# Patient Record
Sex: Female | Born: 1935 | Race: White | Hispanic: No | Smoking: Former smoker
Health system: Southern US, Community
[De-identification: ages and names within clinical notes are randomized; demographics above are authoritative.]

## PROBLEM LIST (undated history)

## (undated) DIAGNOSIS — F0390 Unspecified dementia without behavioral disturbance: Secondary | ICD-10-CM

## (undated) DIAGNOSIS — F419 Anxiety disorder, unspecified: Secondary | ICD-10-CM

## (undated) DIAGNOSIS — E785 Hyperlipidemia, unspecified: Secondary | ICD-10-CM

## (undated) DIAGNOSIS — I10 Essential (primary) hypertension: Secondary | ICD-10-CM

## (undated) HISTORY — PX: EYE SURGERY: SHX253

## (undated) HISTORY — DX: Hyperlipidemia, unspecified: E78.5

## (undated) HISTORY — DX: Anxiety disorder, unspecified: F41.9

## (undated) HISTORY — PX: APPENDECTOMY: SHX54

## (undated) HISTORY — DX: Essential (primary) hypertension: I10

---

## 2004-06-13 ENCOUNTER — Ambulatory Visit: Payer: Self-pay | Admitting: Internal Medicine

## 2005-06-26 ENCOUNTER — Ambulatory Visit: Payer: Self-pay | Admitting: Internal Medicine

## 2006-06-30 ENCOUNTER — Ambulatory Visit: Payer: Self-pay | Admitting: Internal Medicine

## 2007-07-12 ENCOUNTER — Ambulatory Visit: Payer: Self-pay | Admitting: Internal Medicine

## 2008-04-06 ENCOUNTER — Ambulatory Visit: Payer: Self-pay | Admitting: Unknown Physician Specialty

## 2008-07-13 ENCOUNTER — Ambulatory Visit: Payer: Self-pay | Admitting: Internal Medicine

## 2008-07-18 ENCOUNTER — Ambulatory Visit: Payer: Self-pay | Admitting: Internal Medicine

## 2009-01-01 ENCOUNTER — Emergency Department: Payer: Self-pay | Admitting: Emergency Medicine

## 2009-01-18 ENCOUNTER — Ambulatory Visit: Payer: Self-pay | Admitting: Internal Medicine

## 2009-01-24 ENCOUNTER — Ambulatory Visit: Payer: Self-pay | Admitting: Ophthalmology

## 2009-02-06 ENCOUNTER — Ambulatory Visit: Payer: Self-pay | Admitting: Internal Medicine

## 2009-03-15 ENCOUNTER — Ambulatory Visit: Payer: Self-pay | Admitting: Ophthalmology

## 2009-03-28 ENCOUNTER — Ambulatory Visit: Payer: Self-pay | Admitting: Ophthalmology

## 2009-08-02 ENCOUNTER — Ambulatory Visit: Payer: Self-pay | Admitting: Internal Medicine

## 2010-02-27 ENCOUNTER — Ambulatory Visit: Payer: Self-pay

## 2010-09-19 ENCOUNTER — Ambulatory Visit: Payer: Self-pay | Admitting: Gastroenterology

## 2010-10-01 ENCOUNTER — Emergency Department: Payer: Self-pay | Admitting: *Deleted

## 2010-10-07 ENCOUNTER — Ambulatory Visit: Payer: Self-pay | Admitting: Gastroenterology

## 2010-10-18 ENCOUNTER — Emergency Department: Payer: Self-pay | Admitting: Emergency Medicine

## 2013-03-23 IMAGING — CT CT ABDOMEN AND PELVIS WITHOUT AND WITH CONTRAST
2 of 6 series · 13 of 32 positions shown, 19 images · non-contrast
Comparison: none

REASON FOR EXAM: hematuria RLQ abd pain
COMMENTS:

PROCEDURE:     KCT - KCT ABDOMEN/PELVIS W/WO  - September 19, 2010 [DATE]
RESULT:
TECHNIQUE: Helical 3 mm sections were obtained from the lung bases through
the pubic symphysis without administration of oral or intravenous contrast.
This was followed by administration of oral contrast and 15 ml of
0sovue-NII. Also included are delayed images.

[Series 2: stone 3.0 i40f 3 · axial · 0.69mm/px · z∈[-624,-207]mm · 11 of 167 slices shown, 17 images]
[im 14/167  soft-tissue]
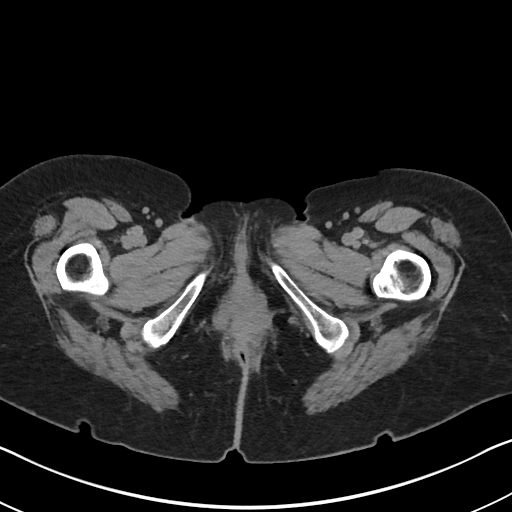
[im 14/167  bone]
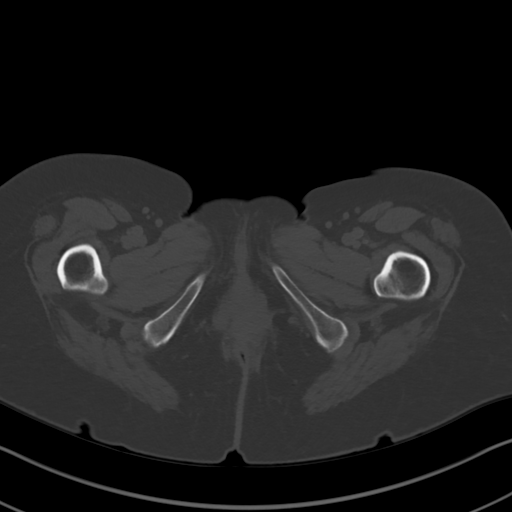
[im 28/167  soft-tissue]
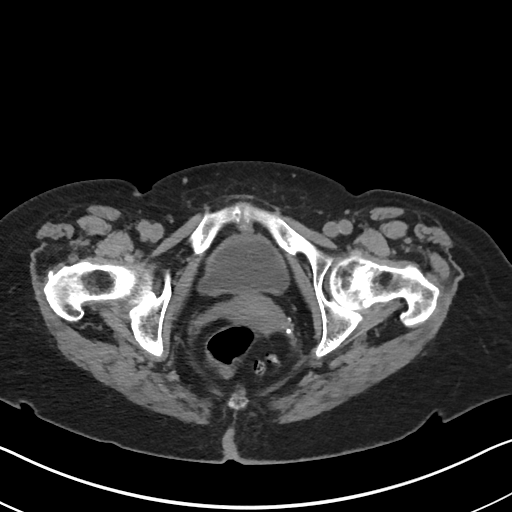
[im 42/167  soft-tissue]
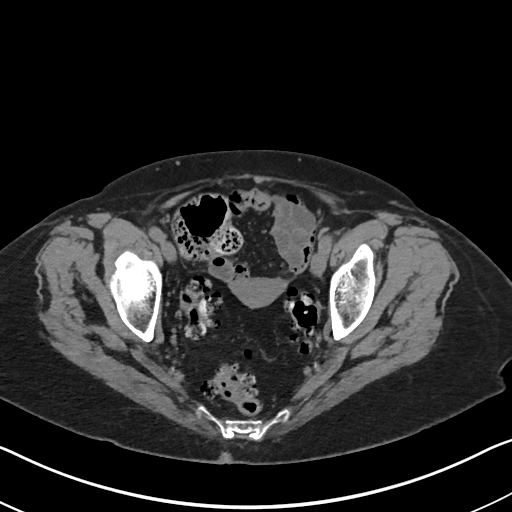
[im 56/167  soft-tissue]
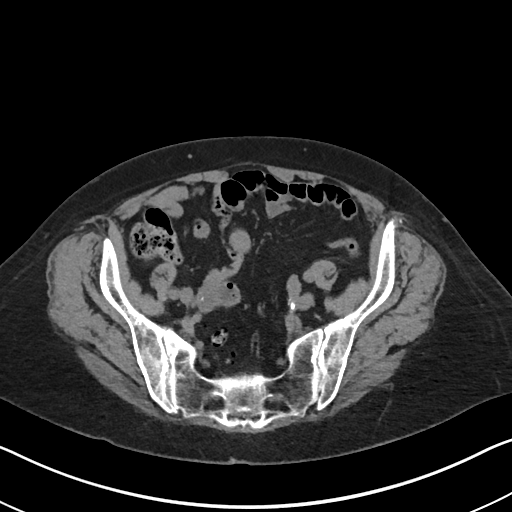
[im 70/167  soft-tissue]
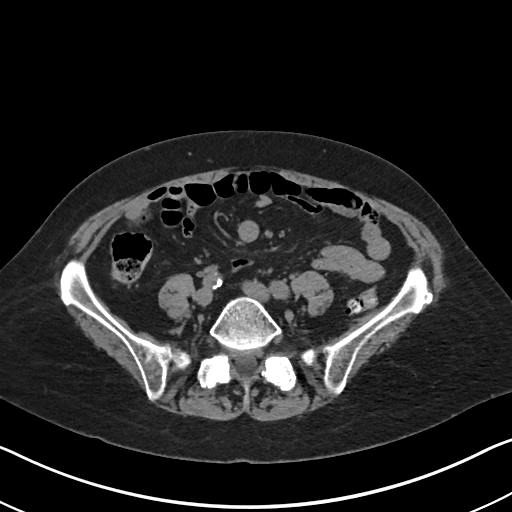
[im 84/167  soft-tissue]
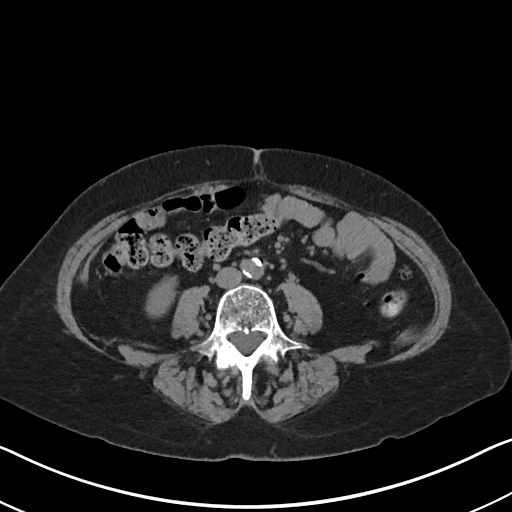
[im 97/167  soft-tissue]
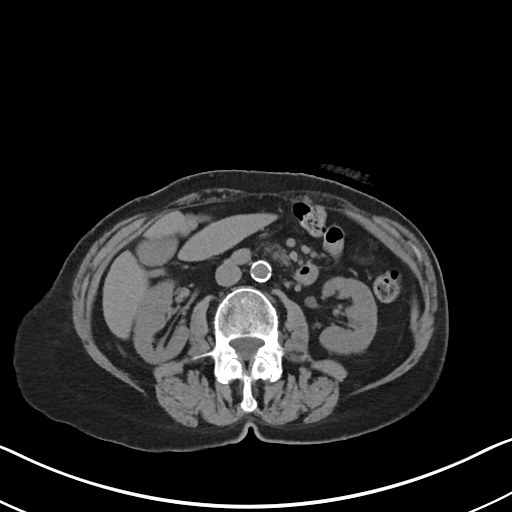
[im 111/167  soft-tissue]
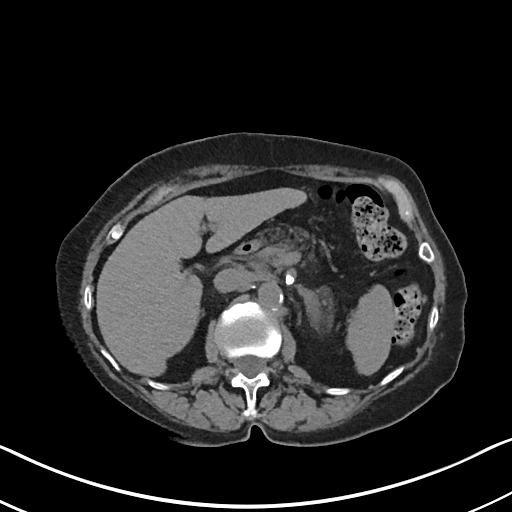
[im 111/167  lung]
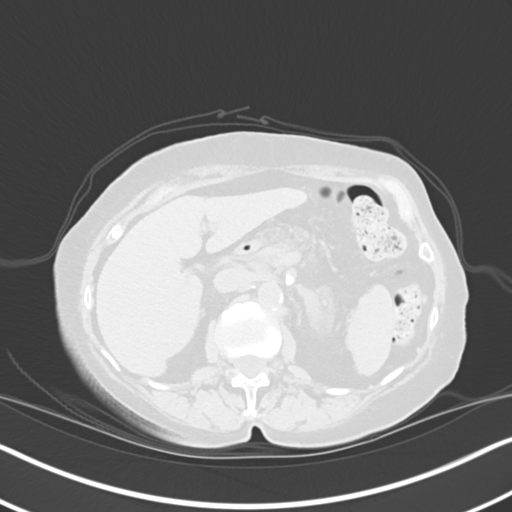
[im 125/167  soft-tissue]
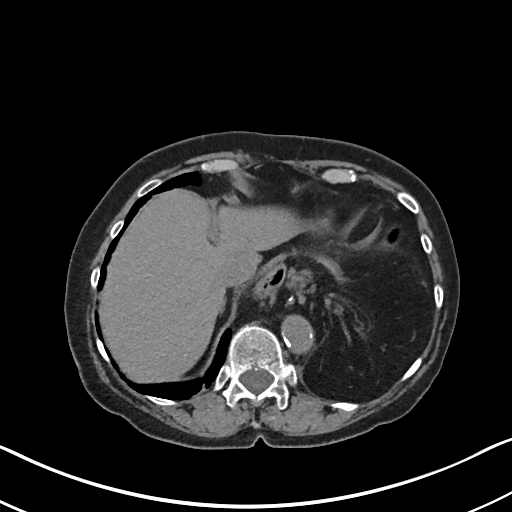
[im 125/167  lung]
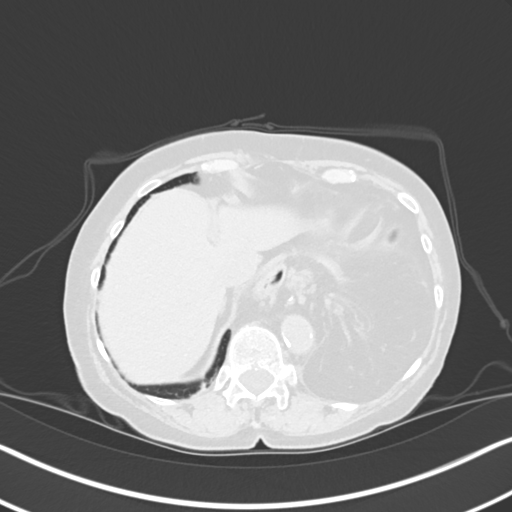
[im 125/167  bone]
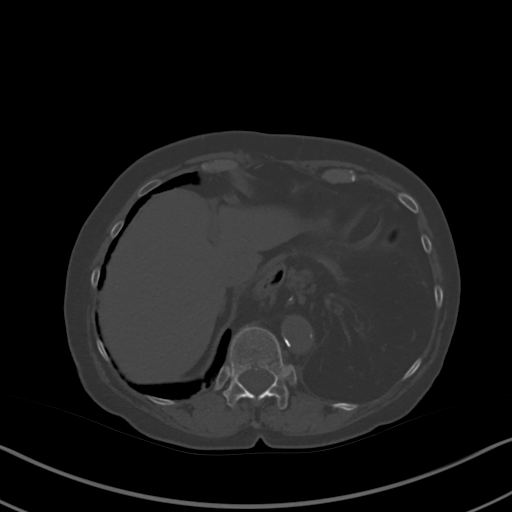
[im 139/167  soft-tissue]
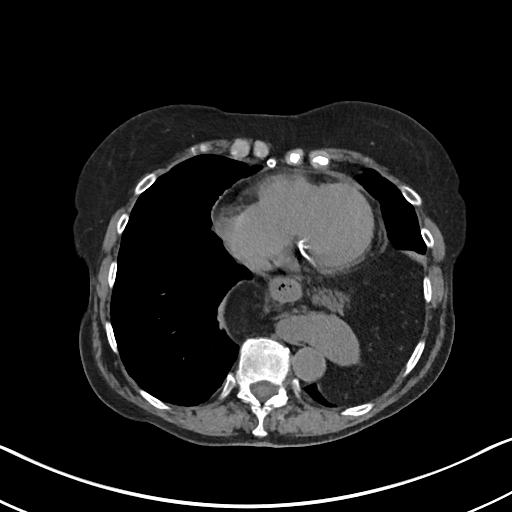
[im 139/167  lung]
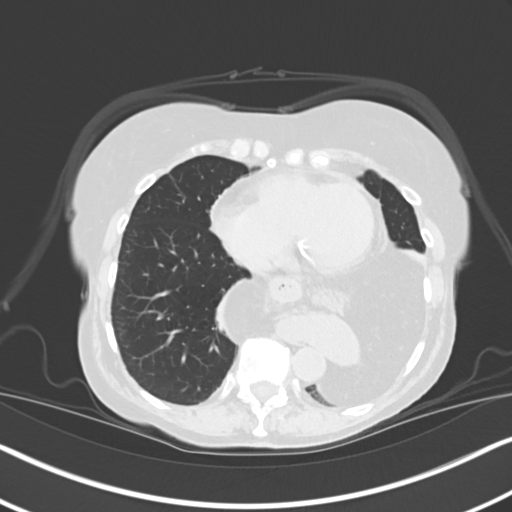
[im 153/167  soft-tissue]
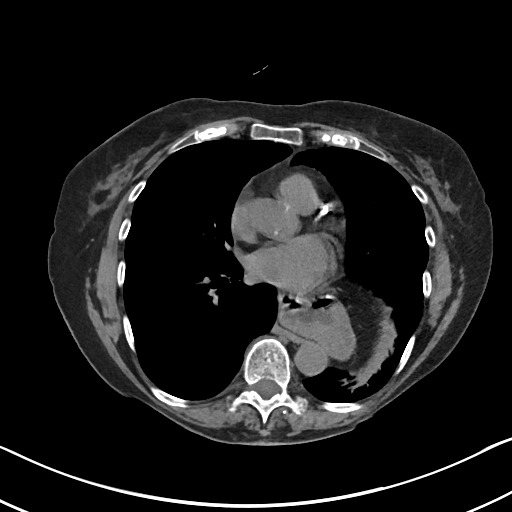
[im 153/167  lung]
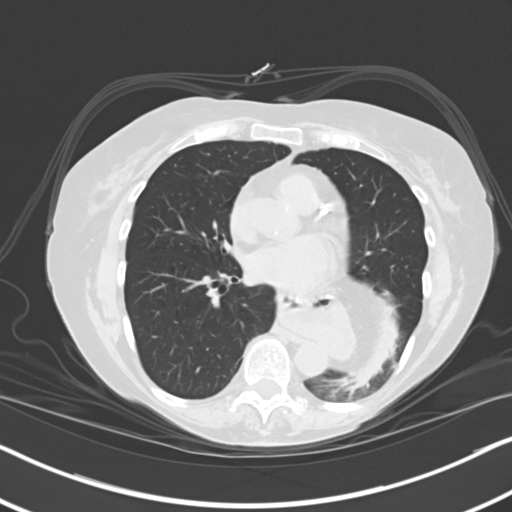

[Series 6: abd with 5.0 i40f 3 · axial · 0.89mm/px · z∈[-724,-644]mm · 2 of 96 slices shown]
[im 16/96  soft-tissue]
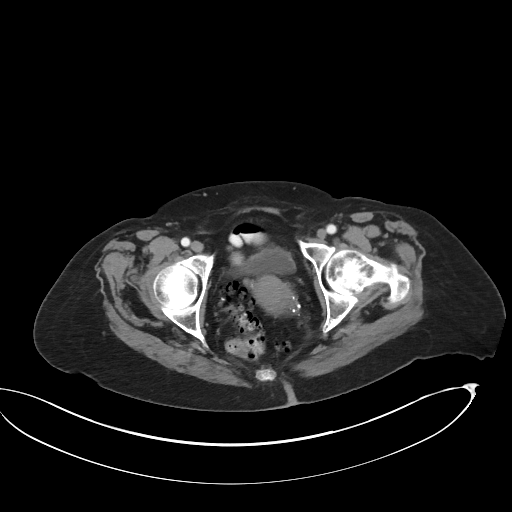
[im 32/96  soft-tissue]
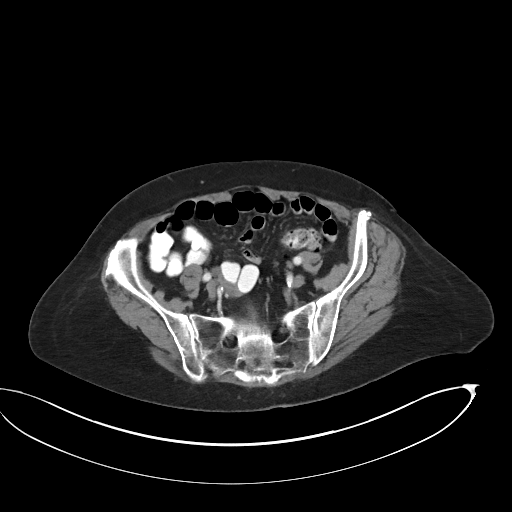

[13 of 32 positions shown; findings below may reference images not displayed]

FINDINGS: Evaluation of the lung bases demonstrates mild increased density
within the left lung base and differential considerations are atelectasis
versus infiltrate.

Evaluation of the right and left kidneys demonstrates no evidence of
hydronephrosis. There is no evidence of nephrolithiasis. There is no
evidence of hydroureter or ureterolithiasis.

The liver, spleen, adrenals, pancreas, and kidneys are unremarkable. There
is no evidence of abdominal aortic aneurysm. Diffuse diverticulosis is
appreciated within the sigmoid colon without evidence of diverticulitis.
There is no CT evidence of bowel obstruction or secondary signs reflecting
enteritis, colitis, diverticulitis or appendicitis. A hiatal hernia is
appreciated which contains a significant portion of the stomach. There is no
evidence of an abdominal aortic aneurysm.
IMPRESSION: 1.     Large, hiatal hernia containing a large portion of stomach.
2.     Diverticulosis within the sigmoid colon.
3.     No CT evidence of obstructive or inflammatory abnormalities.

## 2013-06-06 ENCOUNTER — Ambulatory Visit (INDEPENDENT_AMBULATORY_CARE_PROVIDER_SITE_OTHER): Payer: Medicare Other | Admitting: General Surgery

## 2013-06-06 ENCOUNTER — Encounter: Payer: Self-pay | Admitting: General Surgery

## 2013-06-06 VITALS — BP 120/70 | HR 80 | Resp 14 | Ht 65.5 in | Wt 138.0 lb

## 2013-06-06 DIAGNOSIS — C4490 Unspecified malignant neoplasm of skin, unspecified: Secondary | ICD-10-CM

## 2013-06-06 DIAGNOSIS — D485 Neoplasm of uncertain behavior of skin: Secondary | ICD-10-CM | POA: Insufficient documentation

## 2013-06-06 NOTE — Patient Instructions (Addendum)
Patient to return for excision of left arm skin cancer. The patient is aware to call back for any questions or concerns.

## 2013-06-06 NOTE — Progress Notes (Signed)
Patient ID: Kari Allen, female   DOB: April 20, 1927, 80 y.o.   MRN: [id number]  Chief Complaint  Patient presents with  . Other    Left upper arm skin cancer    HPI Kari Allen is a 80 y.o. female who presents for an evaluation of left upper arm skin cancer. The patient had a biopsy done on 05/25/13 at [facility] with Josue Hector, M.D.. The patient states the area has been present for approximately 8-9 months prior to recent excision. She states the area had a scab that came off and the area didn't look right. She is concerned about a lesion on the left forearm which he thinks looks similar to what the index lesion on the upper arm.  HPI  Past Medical History  Diagnosis Date  . Hyperlipidemia   . Hypertension   . Anxiety     Past Surgical History  Procedure Laterality Date  . Appendectomy    . Cesarean section    . Eye surgery Bilateral     cataract    History reviewed. No pertinent family history.  Social History History  Substance Use Topics  . Smoking status: Former Smoker -- 1.00 packs/day for 15 years    Types: Cigarettes  . Smokeless tobacco: Never Used  . Alcohol Use: Yes    No Known Allergies  Current Outpatient Prescriptions  Medication Sig Dispense Refill  . Calcium Carbonate-Vitamin D (CALCIUM + D PO) Take 1 tablet by mouth daily.      . chlorhexidine (PERIDEX) 0.12 % solution 1 mL by Mouth Rinse route daily.      Marland Kitchen losartan-hydrochlorothiazide (HYZAAR) 100-25 MG per tablet Take 1 tablet by mouth daily.      Marland Kitchen lovastatin (MEVACOR) 20 MG tablet Take 1 tablet by mouth daily.      . Multiple Vitamin (MULTIVITAMIN) tablet Take 1 tablet by mouth daily.      . Omega-3 Fatty Acids (FISH OIL) 1000 MG CAPS Take 1 capsule by mouth 3 (three) times daily.      . potassium chloride (MICRO-K) 10 MEQ CR capsule Take 1 capsule by mouth daily.      . Probiotic Product (PHILLIPS COLON HEALTH PO) Take 1 capsule by mouth daily.      . sertraline  (ZOLOFT) 50 MG tablet Take 50 mg by mouth daily.       No current facility-administered medications for this visit.    Review of Systems Review of Systems  Constitutional: Negative.   Respiratory: Negative.   Cardiovascular: Negative.     Blood pressure 120/70, pulse 80, resp. rate 14, height 5' 5.5" (1.664 m), weight 138 lb (62.596 kg).  Physical Exam Physical Exam  Constitutional: She is oriented to person, place, and time. She appears well-developed and well-nourished.  Neck: Neck supple. No thyromegaly present.  Cardiovascular: Normal rate, regular rhythm and normal heart sounds.   No murmur heard. Pulmonary/Chest: Effort normal and breath sounds normal.  Lymphadenopathy:    She has no cervical adenopathy.    She has no axillary adenopathy.  Neurological: She is alert and oriented to person, place, and time.  Skin: Skin is warm and dry.     Left distal lateral upper arm 13 x 16 mm area.     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 Changes extended to the base of the biopsy. Conservative reexcision was recommended.  Assessment  Fibroxanthoma of the left upper arm.     Plan    The indications for reexcision were reviewed with the patient. I think this can be comfortably completed under local anesthesia as an office procedure. This will be scheduled convenient date. She was advised that she does not need to continue peroxide applications. Neosporin and a Band-Aid are all are required at this time.     PCP: Kirk Ruths Ref MD: Dr. Watt Climes Byrnett 06/06/2013, 10:10 PM

## 2013-06-13 ENCOUNTER — Ambulatory Visit: Payer: Self-pay | Admitting: General Surgery

## 2013-06-27 ENCOUNTER — Encounter: Payer: Self-pay | Admitting: General Surgery

## 2013-06-27 ENCOUNTER — Ambulatory Visit (INDEPENDENT_AMBULATORY_CARE_PROVIDER_SITE_OTHER): Payer: Medicare Other | Admitting: General Surgery

## 2013-06-27 VITALS — BP 130/62 | HR 72 | Resp 14 | Ht 65.0 in | Wt 137.0 lb

## 2013-06-27 DIAGNOSIS — D481 Neoplasm of uncertain behavior of connective and other soft tissue: Secondary | ICD-10-CM | POA: Insufficient documentation

## 2013-06-27 DIAGNOSIS — D4819 Other specified neoplasm of uncertain behavior of connective and other soft tissue: Secondary | ICD-10-CM | POA: Insufficient documentation

## 2013-06-27 DIAGNOSIS — L989 Disorder of the skin and subcutaneous tissue, unspecified: Secondary | ICD-10-CM

## 2013-06-27 DIAGNOSIS — C44601 Unspecified malignant neoplasm of skin of unspecified upper limb, including shoulder: Secondary | ICD-10-CM

## 2013-06-27 HISTORY — PX: ARM SKIN LESION BIOPSY / EXCISION: SUR471

## 2013-06-27 NOTE — Patient Instructions (Addendum)
Follow up appointment to be announced. 1 week nurese

## 2013-06-27 NOTE — Progress Notes (Signed)
Patient ID: Kari Allen, female   DOB: March 15, 1927, 80 y.o.   MRN: [id number]  Chief Complaint  Patient presents with  . Procedure    excision left arm skin cancer    HPI Kari Allen is a 80 y.o. female here today for a excision left arm malignant spindle cell, atypical fibroxanthoma.   HPI  Past Medical History  Diagnosis Date  . Hyperlipidemia   . Hypertension   . Anxiety     Past Surgical History  Procedure Laterality Date  . Appendectomy    . Cesarean section    . Eye surgery Bilateral     cataract    No family history on file.  Social History History  Substance Use Topics  . Smoking status: Former Smoker -- 1.00 packs/day for 15 years    Types: Cigarettes  . Smokeless tobacco: Never Used  . Alcohol Use: Yes    No Known Allergies  Current Outpatient Prescriptions  Medication Sig Dispense Refill  . Calcium Carbonate-Vitamin D (CALCIUM + D PO) Take 1 tablet by mouth daily.      . chlorhexidine (PERIDEX) 0.12 % solution 1 mL by Mouth Rinse route daily.      Marland Kitchen losartan-hydrochlorothiazide (HYZAAR) 100-25 MG per tablet Take 1 tablet by mouth daily.      Marland Kitchen lovastatin (MEVACOR) 20 MG tablet Take 1 tablet by mouth daily.      . Multiple Vitamin (MULTIVITAMIN) tablet Take 1 tablet by mouth daily.      . Omega-3 Fatty Acids (FISH OIL) 1000 MG CAPS Take 1 capsule by mouth 3 (three) times daily.      . potassium chloride (MICRO-K) 10 MEQ CR capsule Take 1 capsule by mouth daily.      . Probiotic Product (PHILLIPS COLON HEALTH PO) Take 1 capsule by mouth daily.      . sertraline (ZOLOFT) 50 MG tablet Take 50 mg by mouth daily.       No current facility-administered medications for this visit.    Review of Systems Review of Systems  Constitutional: Negative.   Respiratory: Negative.   Cardiovascular: Negative.     Blood pressure 130/62, pulse 72, resp. rate 14, height 5\' 5"  (1.651 m), weight 137 lb (62.143 kg).  Physical Exam Physical Exam The area of the  original excision on the lateral aspect of the left upper arm showed continued improvement since her last visit. Maximum diameter is approximately 12 mm.     Assessment    Malignant spindle cell tumor, atypical fibroxanthoma a left arm     Plan    Indication for excision was reviewed with the patient and she was amenable to proceed. 15 cc of 0.5% Xylocaine with 0.25% Marcaine with 1-200,000 units of epinephrine was utilized well-tolerated. Chlor prep was applied to the skin. The area was excised with 5 mm borders. A suture was placed on the superior aspect of the vertically oriented incision. This was sent in formalin for routine histology. The deep tissue was approximated with multiple layers of 3-0 Vicryl suture. The skin was closed with a running 4-0 nylon suture. Dry dressing until some Tegaderm followed by a light Coban dressing was applied.  The patient tolerated the procedure well. She reported no pain with her previous excision and did not desire narcotic analgesics. She will make use of Tylenol or Aleve as needed for pain. We'll plan for a followup exam with the staff in one week for suture removal in position exam in one month.  Forest Gleason Daegen Berrocal 06/27/2013, 8:32 PM

## 2013-06-29 LAB — PATHOLOGY

## 2013-06-30 ENCOUNTER — Telehealth: Payer: Self-pay | Admitting: *Deleted

## 2013-06-30 NOTE — Telephone Encounter (Signed)
Message copied by Carson Myrtle on Thu Jun 30, 2013  1:27 PM ------      Message from: [place], Roy W      Created: Thu Jun 30, 2013 12:37 PM       Please notify the patient the biopsy report is fine. No residual cancer tissue. F/U as scheduled. Thanks/ ------

## 2013-06-30 NOTE — Telephone Encounter (Signed)
Notified patient as instructed, patient pleased. Discussed follow-up appointments (Monday), patient agrees

## 2013-07-04 ENCOUNTER — Ambulatory Visit (INDEPENDENT_AMBULATORY_CARE_PROVIDER_SITE_OTHER): Payer: Medicare Other | Admitting: *Deleted

## 2013-07-04 DIAGNOSIS — L989 Disorder of the skin and subcutaneous tissue, unspecified: Secondary | ICD-10-CM

## 2013-07-04 NOTE — Progress Notes (Signed)
The sutures were removed and steri strips applied.Area looks clean and healing well. 

## 2013-07-27 ENCOUNTER — Encounter: Payer: Self-pay | Admitting: General Surgery

## 2013-07-27 ENCOUNTER — Ambulatory Visit (INDEPENDENT_AMBULATORY_CARE_PROVIDER_SITE_OTHER): Payer: Medicare Other | Admitting: General Surgery

## 2013-07-27 VITALS — BP 128/68 | HR 70 | Resp 12 | Ht 65.0 in | Wt 139.0 lb

## 2013-07-27 DIAGNOSIS — L989 Disorder of the skin and subcutaneous tissue, unspecified: Secondary | ICD-10-CM

## 2013-07-27 NOTE — Patient Instructions (Signed)
Patient to return as needed. 

## 2013-07-27 NOTE — Progress Notes (Signed)
Patient ID: Kari Allen, female   DOB: Oct 03, 1927, 80 y.o.   MRN: [id number]  Chief Complaint  Patient presents with  . Follow-up    1 month follow up post op left arm excision    HPI Kari Allen is a 80 y.o. female who presents for a follow up of left arm skin lesion excision. The procedure was performed on 06/27/13. Patient states she is doing well. No new problems.  HPI  Past Medical History  Diagnosis Date  . Hyperlipidemia   . Hypertension   . Anxiety     Past Surgical History  Procedure Laterality Date  . Appendectomy    . Cesarean section    . Eye surgery Bilateral     cataract  . Arm skin lesion biopsy / excision Left 06/27/2013    History reviewed. No pertinent family history.  Social History History  Substance Use Topics  . Smoking status: Former Smoker -- 1.00 packs/day for 15 years    Types: Cigarettes  . Smokeless tobacco: Never Used  . Alcohol Use: Yes    No Known Allergies  Current Outpatient Prescriptions  Medication Sig Dispense Refill  . Calcium Carbonate-Vitamin D (CALCIUM + D PO) Take 1 tablet by mouth daily.      . chlorhexidine (PERIDEX) 0.12 % solution 1 mL by Mouth Rinse route daily.      Marland Kitchen losartan-hydrochlorothiazide (HYZAAR) 100-25 MG per tablet Take 1 tablet by mouth daily.      Marland Kitchen lovastatin (MEVACOR) 20 MG tablet Take 1 tablet by mouth daily.      . Multiple Vitamin (MULTIVITAMIN) tablet Take 1 tablet by mouth daily.      . Omega-3 Fatty Acids (FISH OIL) 1000 MG CAPS Take 1 capsule by mouth 3 (three) times daily.      . potassium chloride (MICRO-K) 10 MEQ CR capsule Take 1 capsule by mouth daily.      . Probiotic Product (PHILLIPS COLON HEALTH PO) Take 1 capsule by mouth daily.      . sertraline (ZOLOFT) 50 MG tablet Take 50 mg by mouth daily.       No current facility-administered medications for this visit.    Review of Systems Review of Systems  Constitutional: Negative.   Respiratory: Negative.   Cardiovascular:  Negative.     Blood pressure 128/68, pulse 70, resp. rate 12, height 5\' 5"  (1.651 m), weight 139 lb (63.05 kg).  Physical Exam Physical Exam  Constitutional: She appears well-developed and well-nourished.   The incision is well-healed. Very slight dog ears noted superiorly and inferiorly. The should resolve with time.  Data Reviewed PATH REPORT.RELEVANT HX SPEC  Comment   Comments: Pre-operative diagnosis: . MALIGNANT SPINDLE CELL, SUTURE AT 12:00 PATH REPORT.FINAL DX SPEC    Diagnosis: LEFT ARM SKIN LESION: - CHANGES CONSISTENT WITH PREVIOUS SURGICAL PROCEDURE. - NEGATIVE FOR RESIDUAL NEOPLASM. COMMENT: Intradepartmental consultation was obtained. Multiple additional deeper H/E levels were examined.    Assessment    No evidence of residual spindle cell tumor in the left upper arm excision site.    Plan    The patient was encouraged to make use of local massage the area to help resolve the redundant skin issue.    PCP: Sol Passer 07/28/2013, 6:58 AM

## 2013-07-28 DIAGNOSIS — L989 Disorder of the skin and subcutaneous tissue, unspecified: Secondary | ICD-10-CM | POA: Insufficient documentation

## 2013-11-10 ENCOUNTER — Observation Stay: Payer: Self-pay | Admitting: Internal Medicine

## 2013-11-10 LAB — COMPREHENSIVE METABOLIC PANEL
ALT: 25 U/L
AST: 33 U/L (ref 15–37)
Albumin: 3.7 g/dL (ref 3.4–5.0)
Alkaline Phosphatase: 61 U/L
Anion Gap: 7 (ref 7–16)
BILIRUBIN TOTAL: 0.5 mg/dL (ref 0.2–1.0)
BUN: 19 mg/dL — ABNORMAL HIGH (ref 7–18)
CHLORIDE: 103 mmol/L (ref 98–107)
Calcium, Total: 9.6 mg/dL (ref 8.5–10.1)
Co2: 28 mmol/L (ref 21–32)
Creatinine: 1.13 mg/dL (ref 0.60–1.30)
EGFR (Non-African Amer.): 49 — ABNORMAL LOW
GFR CALC AF AMER: 59 — AB
Glucose: 113 mg/dL — ABNORMAL HIGH (ref 65–99)
Osmolality: 279 (ref 275–301)
POTASSIUM: 3.3 mmol/L — AB (ref 3.5–5.1)
Sodium: 138 mmol/L (ref 136–145)
TOTAL PROTEIN: 7 g/dL (ref 6.4–8.2)

## 2013-11-10 LAB — URINALYSIS, COMPLETE
BACTERIA: NONE SEEN
Bilirubin,UR: NEGATIVE
Glucose,UR: NEGATIVE mg/dL (ref 0–75)
Ketone: NEGATIVE
Nitrite: NEGATIVE
Ph: 6 (ref 4.5–8.0)
Protein: 30
RBC,UR: 157 /HPF (ref 0–5)
Specific Gravity: 1.02 (ref 1.003–1.030)
Squamous Epithelial: <1

## 2013-11-10 LAB — CBC
HCT: 42.3 % (ref 35.0–47.0)
HGB: 13.6 g/dL (ref 12.0–16.0)
MCH: 30.6 pg (ref 26.0–34.0)
MCHC: 32.2 g/dL (ref 32.0–36.0)
MCV: 95 fL (ref 80–100)
Platelet: 233 10*3/uL (ref 150–440)
RBC: 4.44 10*6/uL (ref 3.80–5.20)
RDW: 13.7 % (ref 11.5–14.5)
WBC: 9.3 10*3/uL (ref 3.6–11.0)

## 2013-11-10 LAB — LIPASE, BLOOD: LIPASE: 104 U/L (ref 73–393)

## 2013-11-11 LAB — CBC WITH DIFFERENTIAL/PLATELET
Basophil #: 0.1 10*3/uL (ref 0.0–0.1)
Basophil %: 0.9 %
EOS PCT: 1.5 %
Eosinophil #: 0.1 10*3/uL (ref 0.0–0.7)
HCT: 36 % (ref 35.0–47.0)
HGB: 12.2 g/dL (ref 12.0–16.0)
LYMPHS ABS: 1.5 10*3/uL (ref 1.0–3.6)
Lymphocyte %: 22.4 %
MCH: 31.6 pg (ref 26.0–34.0)
MCHC: 33.7 g/dL (ref 32.0–36.0)
MCV: 94 fL (ref 80–100)
Monocyte #: 0.7 x10 3/mm (ref 0.2–0.9)
Monocyte %: 10 %
NEUTROS PCT: 65.2 %
Neutrophil #: 4.4 10*3/uL (ref 1.4–6.5)
Platelet: 198 10*3/uL (ref 150–440)
RBC: 3.85 10*6/uL (ref 3.80–5.20)
RDW: 13.7 % (ref 11.5–14.5)
WBC: 6.8 10*3/uL (ref 3.6–11.0)

## 2013-11-12 LAB — CBC WITH DIFFERENTIAL/PLATELET
BASOS ABS: 0.1 10*3/uL (ref 0.0–0.1)
Basophil %: 0.9 %
EOS PCT: 1.9 %
Eosinophil #: 0.1 10*3/uL (ref 0.0–0.7)
HCT: 36.2 % (ref 35.0–47.0)
HGB: 11.8 g/dL — ABNORMAL LOW (ref 12.0–16.0)
Lymphocyte #: 1.4 10*3/uL (ref 1.0–3.6)
Lymphocyte %: 19.4 %
MCH: 30.8 pg (ref 26.0–34.0)
MCHC: 32.5 g/dL (ref 32.0–36.0)
MCV: 95 fL (ref 80–100)
MONOS PCT: 8.4 %
Monocyte #: 0.6 x10 3/mm (ref 0.2–0.9)
Neutrophil #: 4.8 10*3/uL (ref 1.4–6.5)
Neutrophil %: 69.4 %
Platelet: 187 10*3/uL (ref 150–440)
RBC: 3.82 10*6/uL (ref 3.80–5.20)
RDW: 13.8 % (ref 11.5–14.5)
WBC: 7 10*3/uL (ref 3.6–11.0)

## 2013-11-12 LAB — BASIC METABOLIC PANEL
ANION GAP: 3 — AB (ref 7–16)
BUN: 12 mg/dL (ref 7–18)
CALCIUM: 8.3 mg/dL — AB (ref 8.5–10.1)
CHLORIDE: 110 mmol/L — AB (ref 98–107)
CREATININE: 0.95 mg/dL (ref 0.60–1.30)
Co2: 28 mmol/L (ref 21–32)
EGFR (African American): >60
EGFR (Non-African Amer.): 60 — ABNORMAL LOW
Glucose: 92 mg/dL (ref 65–99)
OSMOLALITY: 281 (ref 275–301)
Potassium: 4 mmol/L (ref 3.5–5.1)
SODIUM: 141 mmol/L (ref 136–145)

## 2013-12-19 ENCOUNTER — Encounter: Payer: Self-pay | Admitting: General Surgery

## 2014-06-10 NOTE — Consult Note (Signed)
PATIENT NAME:  Kari Allen, Kari Allen MR#:  [id number] DATE OF BIRTH:  06/14/1927  DATE OF CONSULTATION:  11/10/2013  REFERRING PHYSICIAN:   CONSULTING PHYSICIAN:  Andria Meuse, NP  REQUESTING PHYSICIAN:  Dr. Fritzi Mandes.    PRIMARY GASTROENTEROLOGIST: Dr. Johnell Comings GASTROENTEROLOGIST: Dr. Lucilla Lame.    PRIMARY CARE PHYSICIAN: Dr. Ouida Sills.    REASON FOR CONSULTATION: Rectal bleeding.   HISTORY OF PRESENT ILLNESS:  Kari Allen is an 81 year old Caucasian female. She presented to the Emergency Room with bright red blood per rectum. She tells me she had been treated with Cipro and Flagyl recently for possible subacute diverticulitis. She developed left-sided abdominal pain that she describes as horrible, pain 5 out of 10 on pain scale, is now down to 0. She says she has had no appetite over the last several weeks. She went to the bathroom and saw a large amount of bright red blood without any clots after a hard stool. She had been taking fiber at home. She notes small amount of hard stools at home, but has since had 2 nonbloody stools since admission. She denies any fever, chills. Denies any nausea or vomiting. Denies any nonsteroidal anti-inflammatory drugs.  Her CT scan of abdomen and pelvis shows sigmoid diverticulosis, a large left-sided diaphragmatic hernia with loops of distal transverse splenic flexure of the colon. Possible intermittent incarceration is raised, hernia contains mesenteric fat as well the entire stomach. Last colonoscopy was 04/06/2008 and she was found to have internal nonbleeding hemorrhoids and diverticulosis of the sigmoid and descending colon and a biopsy was taken in the mid sigmoid colon.  EGD was 10/07/2010 by Dr. Candace Cruise and she was found to have a hiatal hernia, biopsy showed mild fibrosis in the lamina propria and superficial hemorrhage.   PAST MEDICAL AND SURGICAL HISTORY: Diverticulosis, appendectomy, diverticulitis, chronic UTIs, chronic headaches,  hypercholesterolemia, hypertension, hand surgery, and tonsillectomy.   MEDICATIONS PRIOR TO ADMISSION: Aspirin 81 mg daily, calcium 600 mg plus vitamin D 200 units b.i.d., fish oil 1 gram daily, hydrochlorothiazide/losartan 25/100 mg daily, lovastatin  20 mg q.a.m., multivitamin daily, potassium chloride 10 mEq daily.   ALLERGIES: No known drug allergies.   FAMILY HISTORY: There is no known family history of colorectal carcinoma, liver or chronic GI problems. Mother deceased due to old age. Father had history of hypertension.   SOCIAL HISTORY: There is no tobacco or illicit drug use. She does drink a couple of beers per month. She lives alone. She has 4 healthy children. She is a widow.   REVIEW OF SYSTEMS: See history of present illness.   MUSCULOSKELETAL:  She does have arthritic joint pain and occasional back pain.   Otherwise negative 12 point review of systems.   PHYSICAL EXAMINATION:  VITAL SIGNS: Temperature 97.6, pulse 80, respirations 18, blood pressure 170/79, O2 saturation 95% on room air.  GENERAL: She is a well-developed, well-nourished, elderly Caucasian female who is alert, oriented, in acute distress.  HEENT:   Sclera clear nonicteric.  Conjunctivae clear.   Oropharynx pink and moist without any lesions.  NECK: Supple without mass or thyroidomegaly  CHEST:  Heart regular rate and rhythm with normal S1, S2, without murmurs, clicks, rubs, or gallops.  LUNGS: Clear to auscultation bilaterally.  ABDOMEN: Positive bowel sounds x 4. No bruits auscultated. Abdomen is soft, nondistended. She has no rebound, tenderness or guarding. No appreciable hepatosplenomegaly, mass, or hernia.  EXTREMITIES: Without clubbing or edema.  SKIN: Pink, warm and dry without any rash or jaundice.  NEUROLOGIC: Grossly intact.  MUSCULOSKELETAL: Good equal movement and strength bilaterally.  RECTAL: Deferred.  PSYCHIATRIC: Alert, cooperative, normal mood and affect.   LABORATORY STUDIES: Glucose 113,  BUN 19, potassium 3.3, otherwise normal basic metabolic panel. Lipase 104. LFTs normal. CBC normal. Urinalysis shows 3 + blood.  IMPRESSION: Kari Allen is a very pleasant 81 year old Caucasian female with 1 episode of large volume hematochezia after a large formed bowel movement and large left-sided diaphragmatic hernia containing mesenteric fat as well as the entire stomach and a loop of splenic flexure of the colon. There is no evidence of incarceration on CT. She is pain free at this time. She also has diverticulosis and was recently treated with diverticulitis and history of hemorrhoids. Surgery has been consulted to evaluate her large hernia. Differentials include intermittent incarceration of large hernia, diverticular bleeding, or hemorrhoidal bleeding, less likely polyp or malignancy not seen on CT. Last colonoscopy was over 5 years ago, however when I discuss colonoscopy with the patient she really does not want to undergo this at this time given her age. I will re-evaluate her in the morning. I have discussed her care with Dr. Lucilla Lame and our plan of care is below.   PLAN:  1.  Agree with IV fluids, pain control.  2.  Monitor H and H.   3.  Write surgical recommendations.  4.  Hematuria evaluation per attending.   Thank you for allowing [place] to participate in her care.     ____________________________ Andria Meuse, NP klj:[id number] D: 11/10/2013 18:13:00 ET T: 11/10/2013 19:11:29 ET JOB#: [id number]  cc: Ocie Cornfield. Ouida Sills, MD Andria Meuse, NP, <Dictator>  Andria Meuse FNP ELECTRONICALLY SIGNED 11/30/2013 11:23

## 2014-06-10 NOTE — Consult Note (Signed)
PATIENT NAME:  Kari Allen, Kari Allen MR#:  [id number] DATE OF BIRTH:  Feb 24, 1927  DATE OF CONSULTATION:  11/12/2013  REFERRING PHYSICIAN:  Ocie Cornfield. Ouida Sills, MD CONSULTING PHYSICIAN:  Jeannette How. Marina Gravel, MD  REASON FOR CONSULTATION: Hiatal hernia.   HISTORY: This is an 81 year old active healthy white female who is admitted to the hospital with some vague left lower quadrant and left upper quadrant abdominal pain and 1 large bloody stool. She has a history of sigmoid diverticulosis and mild diverticulitis. Workup in the Emergency Room demonstrated a CT scan showing a large type 4 paraesophageal hernia with stomach and colon. There were no signs of incarceration. There was extensive diverticular disease throughout the colon. No masses. No signs of incarceration of the stomach.   In reviewing her history with her, the patient denies any nausea. No vomiting. No dysphagia. No odynophagia. No globus hystericus. No regurgitation. No weight loss. No difficulty swallowing liquids or solids. She is actually unaware that she had a hiatal hernia.   ALLERGIES: None.   MEDICATIONS: Aspirin, calcium, fish oil, hydrochlorothiazide, losartan, lovastatin, multivitamins and potassium chloride.   PAST MEDICAL HISTORY: Significant for diverticulosis, chronic urinary tract infections, hypertension, headaches, and hypercholesterolemia.   PAST SURGICAL HISTORY: Significant for an appendectomy, hand surgery, and tonsillectomy.   SOCIAL HISTORY: She lives alone, independent. Does not smoke. Does not drink.   FAMILY HISTORY: Significant for hypertension.   REVIEW OF SYSTEMS: As described above and as per 10-point review; otherwise, unremarkable.   PHYSICAL EXAMINATION:  GENERAL: The patient is alert and oriented, in no obvious distress.  VITAL SIGNS: Temperature is 97.8, pulse 74, blood pressure is 137/78, and room air saturation is 91%.  LUNGS: Clear.  HEART: Regular rate and rhythm.  ABDOMEN: Soft and entirely  nontender. No hernias. No masses.  EXTREMITIES: Warm and well perfused.  NEUROLOGIC AND PSYCHIATRIC: Unremarkable.   DIAGNOSTIC DATA: Review of CT scan is as described above. Hemoglobin is 11.8, hematocrit 36.2, platelet count 187,000. Electrolytes are unremarkable.   IMPRESSION: This patient likely had a diverticular bleed which has since resolved. She has a long-standing type 4 paraesophageal hernia, which by her history is completely asymptomatic, and as such does not require repair at this time. I do not think that her rectal bleeding is related to her hiatal hernia. I am more than happy to follow up the patient in the future in the office if she would like to discuss this further. We will sign off.   ____________________________ Jeannette How. Marina Gravel, MD mab:MT D: 11/12/2013 09:15:10 ET T: 11/12/2013 11:18:27 ET JOB#: [id number]  cc: Elta Guadel[place] A. Marina Gravel, MD, <Dictator> Ocie Cornfield. Ouida Sills, MD Ekansh Sherk Bettina Gavia MD ELECTRONICALLY SIGNED 11/12/2013 16:45

## 2014-06-10 NOTE — H&P (Signed)
PATIENT NAME:  Kari Allen, Kari Allen MR#:  [id number] DATE OF BIRTH:  04-24-1927  DATE OF ADMISSION:  11/10/2013  PRIMARY CARE PHYSICIAN: Dr. Ouida Sills.   CHIEF COMPLAINT: Bright red blood per rectum today.   HISTORY OF PRESENT ILLNESS: Kari Allen is a very pleasant 81 year old Caucasian female, comes to the Emergency Room with complaint of bright-red blood per rectum. The patient had one large rectal bleed this morning. She said she had the urge to go have a bowel movement and all she saw was bright-red blood per rectum. Thereafter, she came to the Emergency Room where she is hemodynamically stable. Her hemoglobin and hematocrit are stable as well. She had 2 bowel movements, small ones, which were more greenish stools.    The patient recently has been having some left-sided abdominal pain, flank and lower quadrant, and had recently finished a course of Cipro, Flagyl for possible subacute diverticulitis. She has history of diverticular disease and diverticular bleed in the remote past. She is being admitted for further evaluation and management.   PAST MEDICAL HISTORY: 1.  Diverticulosis and internal hemorrhoids per colonoscopy in 2010.  2.  Chronic UTIs.  3.  Headaches.  4.  Hypercholesterolemia.  5.  Hypertension.  6.  Appendectomy.  7.  Hand surgery.  8.  Tonsillectomy.   ALLERGIES: No known drug allergies.   SOCIAL HISTORY: Lives at home by herself. She is independent. Nonsmoker, nonalcoholic.   FAMILY HISTORY: Positive for hypertension.   REVIEW OF SYSTEMS: CONSTITUTIONAL: No fever, fatigue, weakness.  EYES: No blurred or double vision, glaucoma.  ENT: No tinnitus, ear pain, hearing loss.  RESPIRATORY: No cough, wheeze, hemoptysis.  CARDIOVASCULAR: No chest pain, orthopnea, edema.  GASTROINTESTINAL: No nausea, vomiting, diarrhea. Positive for abdominal pain and rectal bleed.  GENITOURINARY: No dysuria, hematuria, or frequency.  ENDOCRINE: No polyuria, nocturia, or thyroid problems.   HEMATOLOGY: No anemia or easy bruising.  SKIN: No acne or rash.  MUSCULOSKELETAL: Positive for arthritis. No swelling or calf.  NEUROLOGIC: No CVA, TIA, dementia, or tremors.  PSYCHIATRIC: No anxiety, depression, or bipolar disorder.  All other systems reviewed and negative.   PHYSICAL EXAMINATION: GENERAL: The patient is awake, alert, oriented x 3, not in acute distress.  VITAL SIGNS: Afebrile, pulse is 83, blood pressure is 149/66, saturations 97% percent on room air.  HEENT: Atraumatic, normocephalic. PERLA. EOMI. Oral mucosa is moist.  NECK: Supple. No JVD. No carotid bruit.  RESPIRATORY: Clear to auscultation bilaterally. No rales, rhonchi, respiratory distress or labored breathing.  CARDIOVASCULAR: Both heart sounds are normal. Rate, rhythm regular. PMI not lateralized. Chest is nontender.  EXTREMITIES: Good pedal pulses, good femoral pulses. No lower extremity edema.  ABDOMEN: Soft, benign. There is some diffuse tenderness in the left flank. No guarding or rigidity or any mass felt. Bowel sounds normally present.  SKIN: Warm and dry.  NEUROLOGIC: Grossly nonfocal.   PSYCHIATRIC: Mood and affect are normal. The patient is alert and oriented x 3.   LABORATORY DATA: CBC within normal limits. UA positive for mild UTI. Lipase is 104, glucose is 113, BUN is 19, creatinine is 1.13, sodium is 138, potassium 3.8. LFTs within normal limits.   CT of the abdomen with contrast shows significant sigmoid diverticulosis without objective evidence of acute diverticulitis. A large left-sided diaphragmatic hernia containing loops of normal appearing distal transverse splenic flexure of the colon. Possibility of intermittent incarceration is raised. The uterus and adnexal structures are within normal limits for age. There is no acute hepatobiliary or acute UTI.  There is subsegmental atelectasis of the left lung base.   ASSESSMENT: An 81 year old Kari Allen with history of hypertension, hyperlipidemia,  comes in with:  1.  Acute rectal bleed, which appears to be either diverticular and/or versus internal hemorrhoid related. The patient has known history of diverticulosis per CT scan and per colonoscopy. Last one was done in 2010. She just recently finished a course of antibiotic with Cipro, Flagyl about a week ago for subacute diverticulitis. The patient will be admitted on the medical floor. Will give her full liquid diet, monitor ins and outs, and have gastroenterology see the patient in consultation. Monitor patient's hemoglobin and transfuse as needed. Her hemoglobin and hematocrit and vitals are stable at this time  2.  Large diaphragmatic hernia with possible CT findings suggestive of intermittent incarceration. The patient does not have elevated white count or fever. She is not tachycardic. Will have surgery take a look at the CAT scan and give further recommendations. Give p.r.n. pain medications and continue full liquid diet for now.  3.  Hypertension. Will continue hydrochlorothiazide/losartan.  4.  Hyperlipidemia, on lovastatin.  5.  Deep vein thrombosis prophylaxis. SCDs and TEDs. I will hold off on antiplatelet agents given GI bleed.  6.  The above was discussed with the patient, no family members present.   TIME SPENT: 50 minutes.    ____________________________ Hart Rochester Posey Pronto, MD sap:at D: 11/10/2013 13:57:58 ET T: 11/10/2013 14:19:54 ET JOB#: [id number]  cc: Norvella Loscalzo A. Posey Pronto, MD, <Dictator> Dr. Ishmael Holter MD ELECTRONICALLY SIGNED 11/10/2013 15:24

## 2014-06-10 NOTE — Consult Note (Signed)
Pt denies any further bleeding.  no nausea, no vomiting, iv out now.  She says she feels back to normal and expects to go home soon.  She has a large hernia and much of gi organs are up in chest.  Not candidate for surgery for this.  Last colon was 03/2008 showing diverticulosis of sig and descending colon.  No new suggestions.  Follow up with [place] as needed.  Electronic Signatures: Manya Silvas (MD)  (Signed on 26-Sep-15 10:56)  Authored  Last Updated: 26-Sep-15 10:56 by Manya Silvas (MD)

## 2014-06-10 NOTE — Consult Note (Signed)
Brief Consult Note: Diagnosis: Rectal Bleed.   Patient was seen by consultant.   Comments: Kari Allen is a very pleasant 81 y/o caucasian female with large volume hematochezia & large left-sided diaphragmatic hernia containing mesenteric fat as well as the entire stomach and a loop of the splenic flexure of the colon. There is no evidence of incarceration on CT.  She is pain free at this time.  She also has diverticulosis & hemorrhoids.  Surgery has been consulted to evaluate large hernia.  Differentials include intermittent incarceration of large hernia, diverticular bleeding, or hemorrhoidal bleeding, less likely polyp or malignancy not seen on CT.  Last colonoscopy over 5 years ago, however patient really does not want to undergo colonoscopy given her age.  I will reevaluate her in the morning.  I have discussed her care with Dr Evangeline Gula [facility] & our plan of care is below.  Plan: 1) Agree with IVFs, pain control 2) Monitor H/H 3) Await surgical recommendations 4) Hematuria evaluation per attending Thanks for allowing [place] to participate in her care.  Please see full dictated note. #[id number].  Electronic Signatures: Andria Meuse (NP)  (Signed 24-Sep-15 18:14)  Authored: Brief Consult Note   Last Updated: 24-Sep-15 18:14 by Andria Meuse (NP)

## 2014-06-10 NOTE — Consult Note (Signed)
Chief Complaint:  Subjective/Chief Complaint Pt denies abdominal pain.  Denies nausea or vomiting.  No further rectal bleeding or melena.  Pt has had 3 soft nonbloody BMs since episode of pain & rectal bleeding yesterday.   VITAL SIGNS/ANCILLARY NOTES: **Vital Signs.:   25-Sep-15 07:40  Vital Signs Type Q 8hr  Temperature Temperature (F) 98.1  Celsius 36.7  Temperature Source oral  Pulse Pulse 80  Respirations Respirations 18  Systolic BP Systolic BP 235  Diastolic BP (mmHg) Diastolic BP (mmHg) 71  Mean BP 102  Pulse Ox % Pulse Ox % 92  Pulse Ox Activity Level  At rest  Oxygen Delivery Room Air/ 21 %   Brief Assessment:  GEN well developed, well nourished, no acute distress, A/Ox3   Cardiac Regular   Respiratory normal resp effort   Gastrointestinal Normal   Gastrointestinal details normal Soft  Nontender  Nondistended  No masses palpable  Bowel sounds normal  No rebound tenderness  No gaurding  No rigidity   EXTR negative cyanosis/clubbing, negative edema   Additional Physical Exam Skin: pink, warm, dry, no jaundice   Lab Results: Routine Hem:  25-Sep-15 04:49   WBC (CBC) 6.8  RBC (CBC) 3.85  Hemoglobin (CBC) 12.2  Hematocrit (CBC) 36.0  Platelet Count (CBC) 198  MCV 94  MCH 31.6  MCHC 33.7  RDW 13.7  Neutrophil % 65.2  Lymphocyte % 22.4  Monocyte % 10.0  Eosinophil % 1.5  Basophil % 0.9  Neutrophil # 4.4  Lymphocyte # 1.5  Monocyte # 0.7  Eosinophil # 0.1  Basophil # 0.1 (Result(s) reported on 11 Nov 2013 at 05:41AM.)   Assessment/Plan:  Assessment/Plan:  Assessment 1) Single episode large volume hematochezia:   Pt declining colonoscopy given her age & understands that we may not have definitive explanation for her episode of rectal bleeding.  It is reassuring that she has had no further rectal bleeding & hgb is stable.  Differentials include intermittent incarceration of large hernia, diverticular bleeding, or hemorrhoidal bleeding, less likely polyp  or malignancy not seen on CT. 2) Large left-sided diaphragmatic hernia:  No evidence of persistent incarceration.  Surgery to evaluate & make recommendations.   Plan 1)  Agree with IV fluids, pain control  2)  Monitor H/H   3)  Await surgical recommendations Please call if you have any questions or concerns [place] will be covering for Dr Allen Norris over the weekend   Electronic Signatures: Andria Meuse (NP)  (Signed 25-Sep-15 11:14)  Authored: Chief Complaint, VITAL SIGNS/ANCILLARY NOTES, Brief Assessment, Lab Results, Assessment/Plan   Last Updated: 25-Sep-15 11:14 by Andria Meuse (NP)

## 2014-06-10 NOTE — Consult Note (Signed)
Brief Consult Note: Diagnosis: large asymptomatic hiatal hernia.   Patient was seen by consultant.   Comments: Attempt to contact PCP unsuccessful. NO indication for repair.  Gladly see her as needed in my office. No further rectal bleeding which is not related to hiatal hernia but rather likley diverticulosis.  Electronic Signatures: Sherri Rad (MD)  (Signed 25-Sep-15 17:32)  Authored: Brief Consult Note   Last Updated: 25-Sep-15 17:32 by Sherri Rad (MD)

## 2014-06-10 NOTE — Discharge Summary (Signed)
PATIENT NAME:  Kari Allen, FERRUFINO MR#:  [id number] DATE OF BIRTH:  [birth year]  DATE OF ADMISSION:  11/10/2013 DATE OF DISCHARGE:  11/12/2013  DIAGNOSES AT TIME OF DISCHARGE: 1. Acute rectal bleed, most likely diverticular.  2. Large diaphragmatic hernia.  3. Hypertension.  4. Hyperlipidemia.  5. Anxiety.   CHIEF COMPLAINT: Rectal bleed.   HISTORY OF PRESENT ILLNESS: Mattalynn Crandle is an 81 year old female who presented the ED complaining of bright red blood per rectum. The patient also had been complaining of some left lower quadrant abdominal pain and had recently finished a course of Cipro and Flagyl.   PAST MEDICAL HISTORY: Significant for diverticulosis, internal hemorrhoids, chronic UTIs, headaches, hyperlipidemia, hypertension, appendectomy, hand surgery and tonsillectomy. Please see H and P for other details.   The patient's CT of the abdomen showed significant sigmoid diverticulosis without objective evidence of acute diverticulitis. There was also evidence of a large left-sided diaphragmatic hernia containing loops of normal-appearing distal transverse bowel. The possibility of  intermittent incarceration was raised. The patient was seen by Dr. Marina Gravel, surgeon, who felt that there was no need for surgical intervention. The patient received intravenous fluids, and hemoglobin was monitored and she remained hemodynamically stable. She did have a slight drop in hemoglobin of 11.8 but this was felt to be dilutional.  The patient was tolerating her diet and appeared stable at the time of discharge.   DISCHARGE MEDICATIONS: She was discharged in stable condition on the following medications: Lovastatin 20 mg once a day, aspirin 81 mg a day, calcium plus D 1 tablet b.i.d., multivitamin 1 tablet once a day, fish oil 1000 mg once a day, HCTZ/losartan 25/100 mg, 1 tablet once a day, KCl 10 mEq once a day, and sertraline 50 mg once a day.   She has been advised to follow up with Dr. Ouida Sills in 1 to 2  weeks' time and call back with any questions or concerns. Dr. Marina Gravel was also willing to follow her up in his clinic as an outpatient.   TOTAL TIME SPENT IN DISCHARGING THE PATIENT: 35 minutes.     ____________________________ Tracie Harrier, MD vh:lm D: 11/12/2013 14:11:28 ET T: 11/13/2013 01:01:04 ET JOB#: [id number]  cc: Tracie Harrier, MD, <Dictator> Tracie Harrier MD ELECTRONICALLY SIGNED 11/24/2013 13:12

## 2017-08-25 ENCOUNTER — Other Ambulatory Visit: Payer: Self-pay | Admitting: Internal Medicine

## 2017-08-25 DIAGNOSIS — R410 Disorientation, unspecified: Secondary | ICD-10-CM

## 2017-08-25 DIAGNOSIS — R270 Ataxia, unspecified: Secondary | ICD-10-CM

## 2017-09-15 ENCOUNTER — Ambulatory Visit
Admission: RE | Admit: 2017-09-15 | Discharge: 2017-09-15 | Disposition: A | Discharge status: Home / Self Care | Payer: Medicare Other | Source: Ambulatory Visit | Attending: Internal Medicine | Admitting: Internal Medicine

## 2017-09-15 DIAGNOSIS — G319 Degenerative disease of nervous system, unspecified: Secondary | ICD-10-CM | POA: Diagnosis not present

## 2017-09-15 DIAGNOSIS — R270 Ataxia, unspecified: Secondary | ICD-10-CM | POA: Diagnosis not present

## 2017-09-15 DIAGNOSIS — R410 Disorientation, unspecified: Secondary | ICD-10-CM | POA: Insufficient documentation

## 2018-08-15 ENCOUNTER — Emergency Department
Admission: EM | Admit: 2018-08-15 | Discharge: 2018-08-15 | Disposition: A | Discharge status: Home / Self Care | Payer: Medicare Other | Attending: Emergency Medicine | Admitting: Emergency Medicine

## 2018-08-15 ENCOUNTER — Other Ambulatory Visit: Payer: Self-pay

## 2018-08-15 ENCOUNTER — Encounter: Payer: Self-pay | Admitting: Emergency Medicine

## 2018-08-15 DIAGNOSIS — F0390 Unspecified dementia without behavioral disturbance: Secondary | ICD-10-CM | POA: Insufficient documentation

## 2018-08-15 DIAGNOSIS — R1110 Vomiting, unspecified: Secondary | ICD-10-CM | POA: Diagnosis present

## 2018-08-15 DIAGNOSIS — Z5321 Procedure and treatment not carried out due to patient leaving prior to being seen by health care provider: Secondary | ICD-10-CM | POA: Insufficient documentation

## 2018-08-15 DIAGNOSIS — K920 Hematemesis: Secondary | ICD-10-CM | POA: Insufficient documentation

## 2018-08-15 HISTORY — DX: Unspecified dementia, unspecified severity, without behavioral disturbance, psychotic disturbance, mood disturbance, and anxiety: F03.90

## 2018-08-15 LAB — CBC
HCT: 41.3 % (ref 36.0–46.0)
Hemoglobin: 13.5 g/dL (ref 12.0–15.0)
MCH: 31.3 pg (ref 26.0–34.0)
MCHC: 32.7 g/dL (ref 30.0–36.0)
MCV: 95.6 fL (ref 80.0–100.0)
Platelets: 227 10*3/uL (ref 150–400)
RBC: 4.32 MIL/uL (ref 3.87–5.11)
RDW: 13.2 % (ref 11.5–15.5)
WBC: 13.4 10*3/uL — ABNORMAL HIGH (ref 4.0–10.5)
nRBC: 0 % (ref 0.0–0.2)

## 2018-08-15 LAB — COMPREHENSIVE METABOLIC PANEL
ALT: 23 U/L (ref 0–44)
AST: 28 U/L (ref 15–41)
Albumin: 4.3 g/dL (ref 3.5–5.0)
Alkaline Phosphatase: 65 U/L (ref 38–126)
Anion gap: 15 (ref 5–15)
BUN: 29 mg/dL — ABNORMAL HIGH (ref 8–23)
CO2: 30 mmol/L (ref 22–32)
Calcium: 9.4 mg/dL (ref 8.9–10.3)
Chloride: 99 mmol/L (ref 98–111)
Creatinine, Ser: 1.44 mg/dL — ABNORMAL HIGH (ref 0.44–1.00)
GFR calc Af Amer: 37 mL/min — ABNORMAL LOW (ref >60)
GFR calc non Af Amer: 32 mL/min — ABNORMAL LOW (ref >60)
Glucose, Bld: 153 mg/dL — ABNORMAL HIGH (ref 70–99)
Potassium: 3.9 mmol/L (ref 3.5–5.1)
Sodium: 144 mmol/L (ref 135–145)
Total Bilirubin: 0.7 mg/dL (ref 0.3–1.2)
Total Protein: 6.9 g/dL (ref 6.5–8.1)

## 2018-08-15 LAB — TYPE AND SCREEN
ABO/RH(D): O POS
Antibody Screen: NEGATIVE

## 2018-08-15 NOTE — ED Notes (Signed)
Full rainbow and type and screen sent to lab by this RN.

## 2018-08-15 NOTE — ED Triage Notes (Signed)
Pt presents to ED via POV with her daughter, pt's daughter reports hx of dementia, reports that over the last few hours patient has thrown up several times, and noted to have coffee ground emesis. Pt with what appears to be dried blood on her shirt. Pt denies pain at this time. Pt's daughter states pt takes miralax and milk of mag frequently due to fixation on BM and not remembering that she had a BM.

## 2018-08-24 ENCOUNTER — Other Ambulatory Visit: Payer: Self-pay | Admitting: Nurse Practitioner

## 2018-08-24 ENCOUNTER — Other Ambulatory Visit (HOSPITAL_COMMUNITY): Payer: Self-pay | Admitting: Nurse Practitioner

## 2018-08-24 DIAGNOSIS — R112 Nausea with vomiting, unspecified: Secondary | ICD-10-CM

## 2018-08-24 DIAGNOSIS — K449 Diaphragmatic hernia without obstruction or gangrene: Secondary | ICD-10-CM

## 2018-08-26 ENCOUNTER — Other Ambulatory Visit: Payer: Self-pay | Admitting: Nurse Practitioner

## 2018-08-26 ENCOUNTER — Other Ambulatory Visit: Payer: Self-pay

## 2018-08-26 ENCOUNTER — Ambulatory Visit
Admission: RE | Admit: 2018-08-26 | Discharge: 2018-08-26 | Disposition: A | Discharge status: Home / Self Care | Payer: Medicare Other | Source: Ambulatory Visit | Attending: Nurse Practitioner | Admitting: Nurse Practitioner

## 2018-08-26 DIAGNOSIS — R112 Nausea with vomiting, unspecified: Secondary | ICD-10-CM | POA: Diagnosis not present

## 2018-08-26 DIAGNOSIS — K449 Diaphragmatic hernia without obstruction or gangrene: Secondary | ICD-10-CM

## 2022-10-19 DEATH — deceased
